# Patient Record
Sex: Female | Born: 1975 | Hispanic: No | Marital: Married | Smoking: Never smoker
Health system: Southern US, Community
[De-identification: ages and names within clinical notes are randomized; demographics above are authoritative.]

---

## 1997-11-26 ENCOUNTER — Other Ambulatory Visit: Admission: RE | Admit: 1997-11-26 | Discharge: 1997-11-26 | Payer: Self-pay | Admitting: Obstetrics and Gynecology

## 1998-11-21 ENCOUNTER — Other Ambulatory Visit: Admission: RE | Admit: 1998-11-21 | Discharge: 1998-11-21 | Payer: Self-pay | Admitting: Obstetrics and Gynecology

## 1999-12-04 ENCOUNTER — Other Ambulatory Visit: Admission: RE | Admit: 1999-12-04 | Discharge: 1999-12-04 | Payer: Self-pay | Admitting: Obstetrics and Gynecology

## 2000-05-30 ENCOUNTER — Inpatient Hospital Stay (HOSPITAL_COMMUNITY): Admission: AD | Admit: 2000-05-30 | Discharge: 2000-05-30 | Payer: Self-pay | Admitting: Obstetrics and Gynecology

## 2000-09-20 ENCOUNTER — Encounter: Payer: Self-pay | Admitting: Obstetrics and Gynecology

## 2000-09-20 ENCOUNTER — Inpatient Hospital Stay (HOSPITAL_COMMUNITY): Admission: AD | Admit: 2000-09-20 | Discharge: 2000-09-26 | Payer: Self-pay | Admitting: Obstetrics and Gynecology

## 2000-09-27 ENCOUNTER — Encounter: Admission: RE | Admit: 2000-09-27 | Discharge: 2000-10-27 | Payer: Self-pay | Admitting: Obstetrics and Gynecology

## 2000-11-27 ENCOUNTER — Encounter: Admission: RE | Admit: 2000-11-27 | Discharge: 2000-12-27 | Payer: Self-pay | Admitting: Obstetrics and Gynecology

## 2000-12-24 ENCOUNTER — Other Ambulatory Visit: Admission: RE | Admit: 2000-12-24 | Discharge: 2000-12-24 | Payer: Self-pay | Admitting: Obstetrics and Gynecology

## 2002-03-04 ENCOUNTER — Other Ambulatory Visit: Admission: RE | Admit: 2002-03-04 | Discharge: 2002-03-04 | Payer: Self-pay | Admitting: Obstetrics and Gynecology

## 2003-04-02 ENCOUNTER — Other Ambulatory Visit: Admission: RE | Admit: 2003-04-02 | Discharge: 2003-04-02 | Payer: Self-pay | Admitting: Obstetrics and Gynecology

## 2004-08-28 ENCOUNTER — Inpatient Hospital Stay (HOSPITAL_COMMUNITY): Admission: AD | Admit: 2004-08-28 | Discharge: 2004-08-28 | Payer: Self-pay | Admitting: Obstetrics and Gynecology

## 2004-09-15 ENCOUNTER — Inpatient Hospital Stay (HOSPITAL_COMMUNITY): Admission: AD | Admit: 2004-09-15 | Discharge: 2004-09-18 | Payer: Self-pay | Admitting: Obstetrics and Gynecology

## 2004-10-24 ENCOUNTER — Other Ambulatory Visit: Admission: RE | Admit: 2004-10-24 | Discharge: 2004-10-24 | Payer: Self-pay | Admitting: Obstetrics and Gynecology

## 2005-07-27 ENCOUNTER — Ambulatory Visit (HOSPITAL_BASED_OUTPATIENT_CLINIC_OR_DEPARTMENT_OTHER): Admission: RE | Admit: 2005-07-27 | Discharge: 2005-07-27 | Payer: Self-pay | Admitting: Surgery

## 2005-09-20 ENCOUNTER — Ambulatory Visit (HOSPITAL_BASED_OUTPATIENT_CLINIC_OR_DEPARTMENT_OTHER): Admission: RE | Admit: 2005-09-20 | Discharge: 2005-09-20 | Payer: Self-pay | Admitting: Orthopedic Surgery

## 2006-06-18 HISTORY — PX: REDUCTION MAMMAPLASTY: SUR839

## 2011-02-27 ENCOUNTER — Other Ambulatory Visit: Payer: Self-pay | Admitting: Obstetrics and Gynecology

## 2012-11-06 ENCOUNTER — Other Ambulatory Visit: Payer: Self-pay | Admitting: Obstetrics and Gynecology

## 2013-11-12 ENCOUNTER — Other Ambulatory Visit: Payer: Self-pay | Admitting: Obstetrics and Gynecology

## 2017-01-11 ENCOUNTER — Other Ambulatory Visit: Payer: Self-pay | Admitting: Obstetrics and Gynecology

## 2017-01-11 DIAGNOSIS — R928 Other abnormal and inconclusive findings on diagnostic imaging of breast: Secondary | ICD-10-CM

## 2017-01-16 ENCOUNTER — Ambulatory Visit
Admission: RE | Admit: 2017-01-16 | Discharge: 2017-01-16 | Disposition: A | Discharge status: Home / Self Care | Payer: 59 | Source: Ambulatory Visit | Attending: Obstetrics and Gynecology | Admitting: Obstetrics and Gynecology

## 2017-01-16 DIAGNOSIS — R928 Other abnormal and inconclusive findings on diagnostic imaging of breast: Secondary | ICD-10-CM

## 2019-03-16 IMAGING — MG 2D DIGITAL DIAGNOSTIC UNILATERAL LEFT MAMMOGRAM WITH CAD AND ADJ
6 series · 6 of 14 positions shown · non-contrast
Comparison: Previous exam(s).

CLINICAL DATA: Screening recall for a possible left breast mass.

EXAM:
2D DIGITAL DIAGNOSTIC LEFT MAMMOGRAM WITH CAD AND ADJUNCT TOMO
ULTRASOUND LEFT BREAST

[L CC synth-2D]
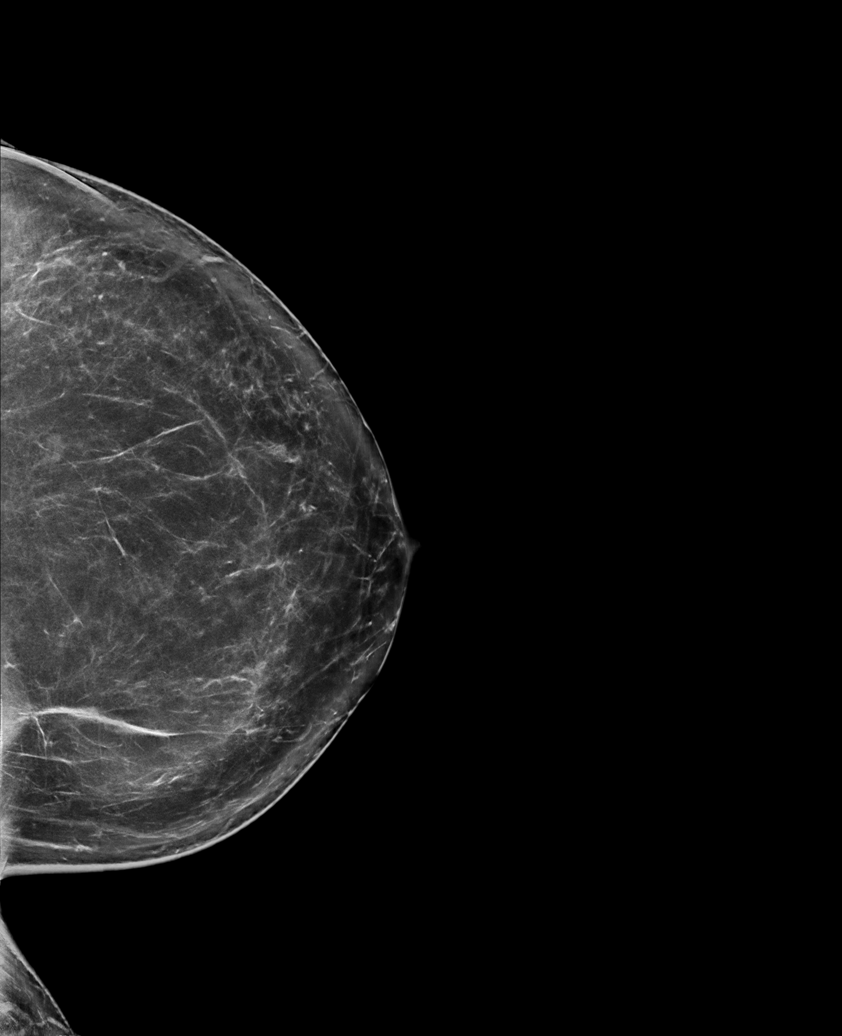

[L MLO]
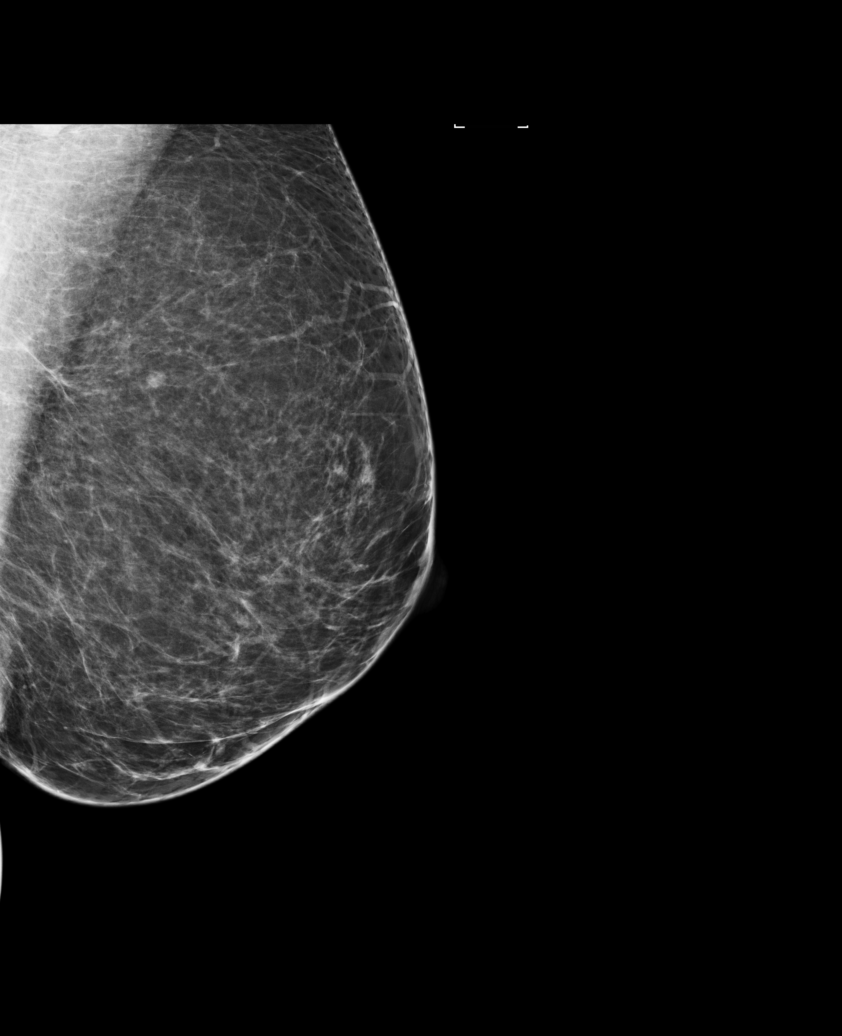

[L MLO synth-2D]
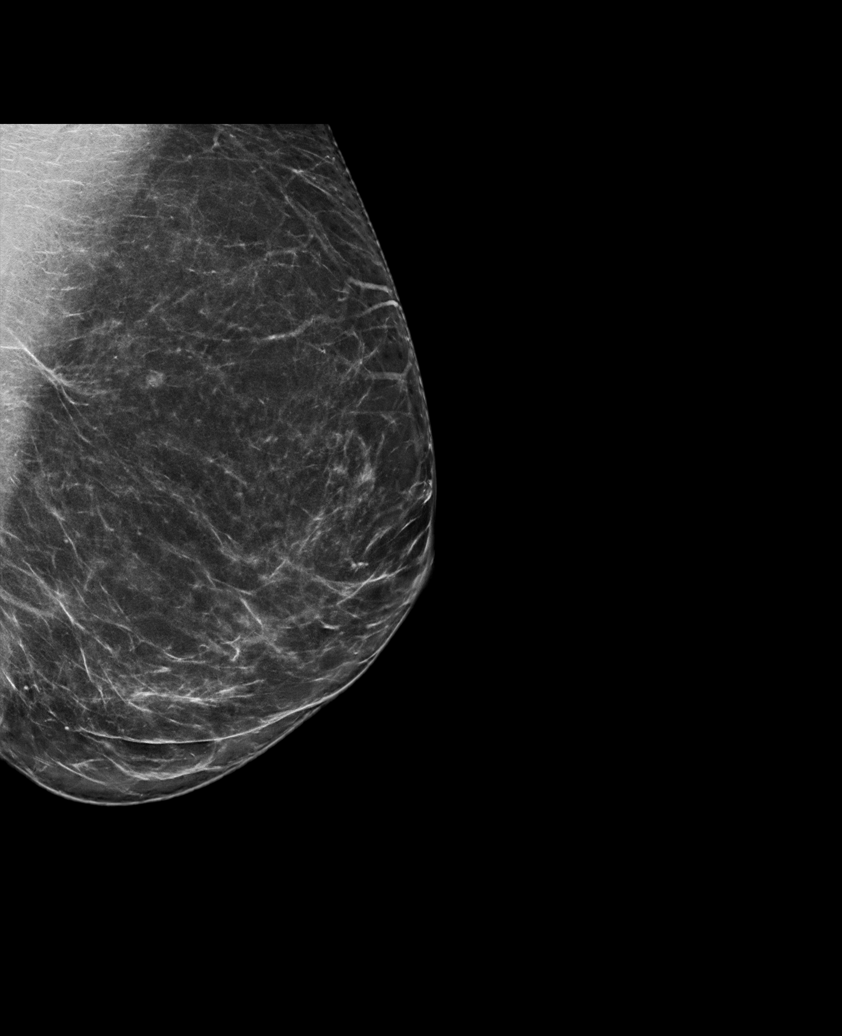

[L CC]
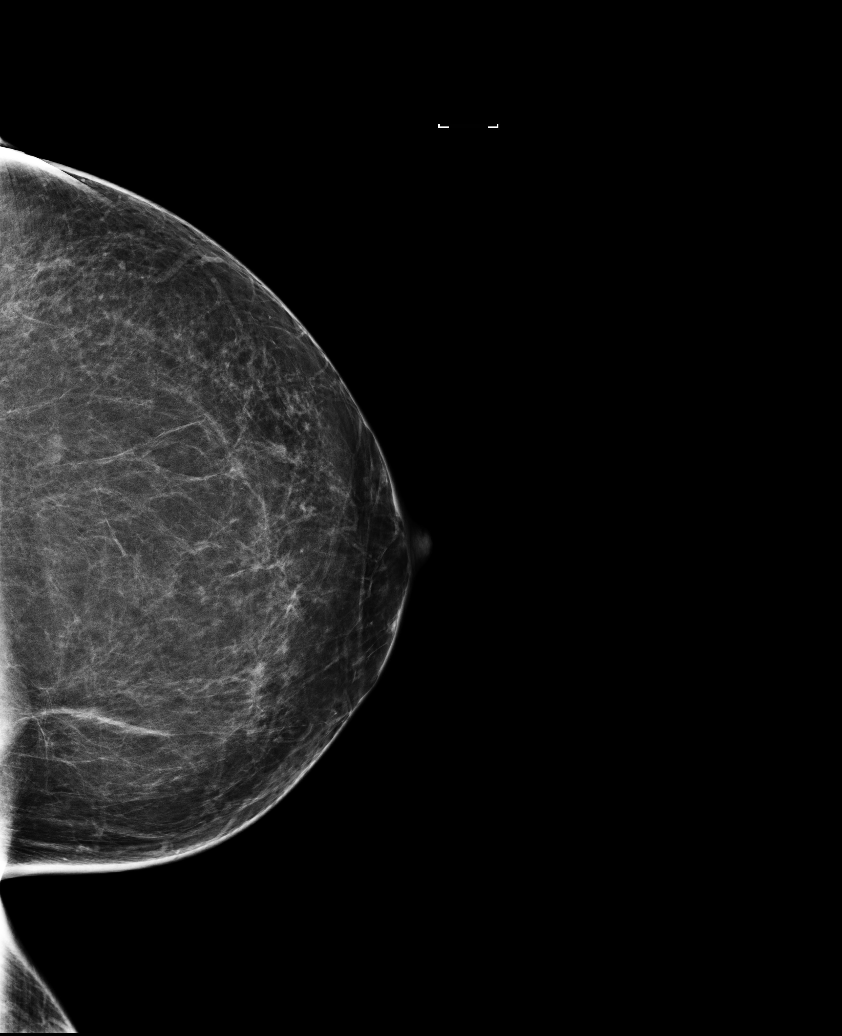

[L CC tomo · tomo slice 45/89.0]
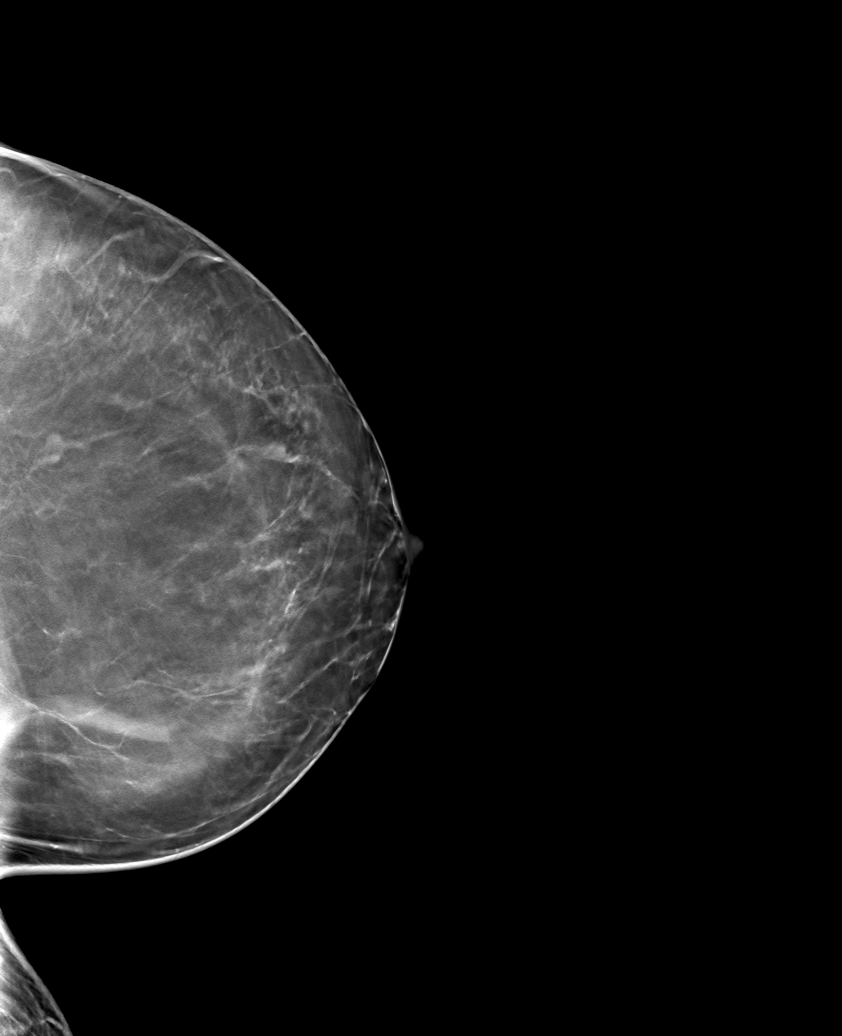

[L MLO tomo · tomo slice 43/86.0]
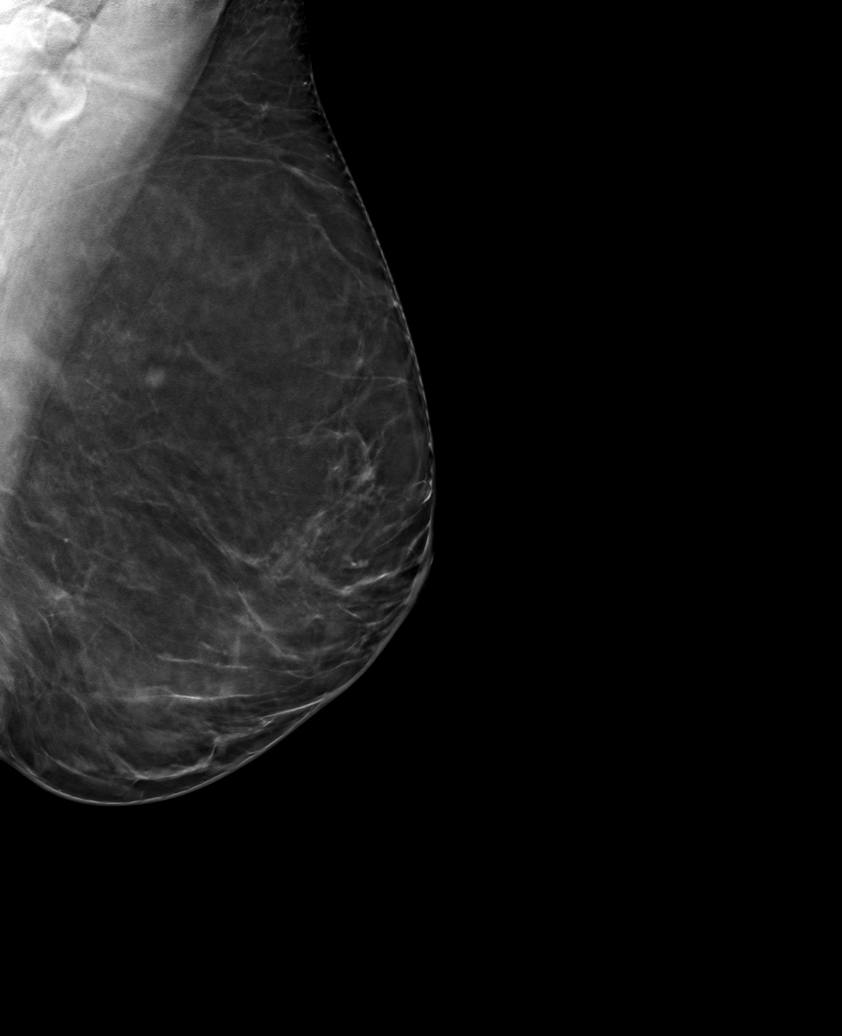

[6 of 14 positions shown; findings below may reference images not displayed]

ACR Breast Density Category b: There are scattered areas of
fibroglandular density.
FINDINGS: The possible mass persists on the diagnostic images. It is round
with circumscribed margins in the lateral left breast.

Mammographic images were processed with CAD.

Targeted ultrasound is performed, showing a small cyst in the left
breast at the 2:30 o'clock position, 4 cm the nipple, posterior
depth, measuring 4 x 3 x 4 mm, consistent in size, shape and
location to the mammographic finding. There are no solid masses or
suspicious lesions.
IMPRESSION: 1. No evidence of malignancy.
2. Small benign left breast cyst.

RECOMMENDATION:
Screening mammogram in one year.(Code:15-7-DV4)

I have discussed the findings and recommendations with the patient.
Results were also provided in writing at the conclusion of the
visit. If applicable, a reminder letter will be sent to the patient
regarding the next appointment.

BI-RADS CATEGORY  2: Benign.

## 2020-05-02 ENCOUNTER — Ambulatory Visit (INDEPENDENT_AMBULATORY_CARE_PROVIDER_SITE_OTHER): Payer: 59 | Admitting: Family Medicine

## 2020-05-02 ENCOUNTER — Other Ambulatory Visit: Payer: Self-pay

## 2020-05-02 ENCOUNTER — Encounter: Payer: Self-pay | Admitting: Family Medicine

## 2020-05-02 VITALS — BP 126/78 | HR 72 | Temp 97.9°F | Ht 65.0 in | Wt 140.8 lb

## 2020-05-02 DIAGNOSIS — R030 Elevated blood-pressure reading, without diagnosis of hypertension: Secondary | ICD-10-CM | POA: Insufficient documentation

## 2020-05-02 NOTE — Progress Notes (Signed)
New Patient Office Visit  Subjective:  Patient ID: Miranda Everett, female    DOB: [birth year]  Age: 44 y.o. MRN: [id number]  CC:  Chief Complaint  Patient presents with  . Hypertension    HPI Miranda Everett presents for elevated blood pressure-noted in [employer]. Pt with no h/o HTN. Pt states she becomes anxious in GYN office.  No headaches, palpitations, visual changes.   LMP-currently  Past Surgical History:  Procedure Laterality Date  . CESAREAN SECTION    . REDUCTION MAMMAPLASTY Bilateral 2008    Family History  Problem Relation Age of Onset  . Cancer Mother   . Alcohol abuse Father   arthritis-aunts HTN-aunt Mother-breast cancer, skin cancer  19yo-daughter and 15yo-daughter-freshman Social History   Socioeconomic History  . Marital status: Married    Spouse name: Not on file  . Number of children: Not on file  . Years of education: Not on file  . Highest education level: Not on file  Occupational History  . Not on file  Tobacco Use  . Smoking status: Never Smoker  . Smokeless tobacco: Never Used  Vaping Use  . Vaping Use: Never used  Substance and Sexual Activity  . Alcohol use: Never  . Drug use: Never  . Sexual activity: Not on file  Other Topics Concern  . Not on file  Social History Narrative  . Not on file   Social Determinants of Health   Financial Resource Strain:   . Difficulty of Paying Living Expenses: Not on file  Food Insecurity:   . Worried About Charity fundraiser in the Last Year: Not on file  . Ran Out of Food in the Last Year: Not on file  Transportation Needs:   . Lack of Transportation (Medical): Not on file  . Lack of Transportation (Non-Medical): Not on file  Physical Activity:   . Days of Exercise per Week: Not on file  . Minutes of Exercise per Session: Not on file  Stress:   . Feeling of Stress : Not on file  Social Connections:   . Frequency of Communication with Friends and Family: Not on file  .  Frequency of Social Gatherings with Friends and Family: Not on file  . Attends Religious Services: Not on file  . Active Member of Clubs or Organizations: Not on file  . Attends Archivist Meetings: Not on file  . Marital Status: Not on file  Intimate Partner Violence:   . Fear of Current or Ex-Partner: Not on file  . Emotionally Abused: Not on file  . Physically Abused: Not on file  . Sexually Abused: Not on file    ROS Review of Systems  Constitutional: Negative.   HENT:       Metallic taste  Eyes:       Glasses -readers-Lasic  Respiratory:       COVID in Sept 2021  Cardiovascular: Negative.   Gastrointestinal: Negative.   Endocrine: Negative.   Genitourinary: Negative.   Musculoskeletal: Negative.        ACL/MCL-right repair  Skin:       Derm -yearly  Allergic/Immunologic: Negative.   Neurological: Negative.   Hematological: Negative.   Psychiatric/Behavioral: Negative.     Objective:   Today's Vitals: BP 126/78 (BP Location: Left Arm, Patient Position: Sitting, Cuff Size: Normal)   Pulse 72   Temp 97.9 F (36.6 C) (Temporal)   Ht 5' 5"  (1.651 m)   Wt 140 lb 12.8 oz (  63.9 kg)   SpO2 97%   BMI 23.43 kg/m   Physical Exam Constitutional:      Appearance: Normal appearance. She is normal weight.  HENT:     Head: Normocephalic and atraumatic.     Nose: Nose normal.     Mouth/Throat:     Mouth: Mucous membranes are moist.  Eyes:     Conjunctiva/sclera: Conjunctivae normal.  Cardiovascular:     Rate and Rhythm: Normal rate and regular rhythm.     Pulses: Normal pulses.     Heart sounds: Normal heart sounds.  Pulmonary:     Effort: Pulmonary effort is normal.     Breath sounds: Normal breath sounds.  Abdominal:     General: Bowel sounds are normal.     Palpations: Abdomen is soft.  Musculoskeletal:        General: Normal range of motion.     Cervical back: Normal range of motion and neck supple.  Skin:    General: Skin is warm.    Neurological:     General: No focal deficit present.     Mental Status: She is alert.  Psychiatric:        Mood and Affect: Mood normal.     Assessment & Plan:  1. Elevated blood pressure reading - EKG 12-Lead-reviewed-sr - CMP14+EGFR - T4 AND TSH - Lipid Profile - CBC with Differential/Platelet Follow-up:  Continue to check blood pressure at home-if elevated over 130/80 consistently-schedule follow up to discuss medication  Mally Gavina Hannah Beat, MD

## 2020-05-02 NOTE — Patient Instructions (Signed)
Managing Your Hypertension Hypertension is commonly called high blood pressure. This is when the force of your blood pressing against the walls of your arteries is too strong. Arteries are blood vessels that carry blood from your heart throughout your body. Hypertension forces the heart to work harder to pump blood, and may cause the arteries to become narrow or stiff. Having untreated or uncontrolled hypertension can cause heart attack, stroke, kidney disease, and other problems. What are blood pressure readings? A blood pressure reading consists of a higher number over a lower number. Ideally, your blood pressure should be below 120/80. The first ("top") number is called the systolic pressure. It is a measure of the pressure in your arteries as your heart beats. The second ("bottom") number is called the diastolic pressure. It is a measure of the pressure in your arteries as the heart relaxes. What does my blood pressure reading mean? Blood pressure is classified into four stages. Based on your blood pressure reading, your health care provider may use the following stages to determine what type of treatment you need, if any. Systolic pressure and diastolic pressure are measured in a unit called mm Hg. Normal  Systolic pressure: below 120.  Diastolic pressure: below 80. Elevated  Systolic pressure: 120-129.  Diastolic pressure: below 80. Hypertension stage 1  Systolic pressure: 130-139.  Diastolic pressure: 80-89. Hypertension stage 2  Systolic pressure: 140 or above.  Diastolic pressure: 90 or above. What health risks are associated with hypertension? Managing your hypertension is an important responsibility. Uncontrolled hypertension can lead to:  A heart attack.  A stroke.  A weakened blood vessel (aneurysm).  Heart failure.  Kidney damage.  Eye damage.  Metabolic syndrome.  Memory and concentration problems. What changes can I make to manage my  hypertension? Hypertension can be managed by making lifestyle changes and possibly by taking medicines. Your health care provider will help you make a plan to bring your blood pressure within a normal range. Eating and drinking   Eat a diet that is high in fiber and potassium, and low in salt (sodium), added sugar, and fat. An example eating plan is called the DASH (Dietary Approaches to Stop Hypertension) diet. To eat this way: ? Eat plenty of fresh fruits and vegetables. Try to fill half of your plate at each meal with fruits and vegetables. ? Eat whole grains, such as whole wheat pasta, brown rice, or whole grain bread. Fill about one quarter of your plate with whole grains. ? Eat low-fat diary products. ? Avoid fatty cuts of meat, processed or cured meats, and poultry with skin. Fill about one quarter of your plate with lean proteins such as fish, chicken without skin, beans, eggs, and tofu. ? Avoid premade and processed foods. These tend to be higher in sodium, added sugar, and fat.  Reduce your daily sodium intake. Most people with hypertension should eat less than 1,500 mg of sodium a day.  Limit alcohol intake to no more than 1 drink a day for nonpregnant women and 2 drinks a day for men. One drink equals 12 oz of beer, 5 oz of wine, or 1 oz of hard liquor. Lifestyle  Work with your health care provider to maintain a healthy body weight, or to lose weight. Ask what an ideal weight is for you.  Get at least 30 minutes of exercise that causes your heart to beat faster (aerobic exercise) most days of the week. Activities may include walking, swimming, or biking.  Include exercise   to strengthen your muscles (resistance exercise), such as weight lifting, as part of your weekly exercise routine. Try to do these types of exercises for 30 minutes at least 3 days a week.  Do not use any products that contain nicotine or tobacco, such as cigarettes and e-cigarettes. If you need help quitting,  ask your health care provider.  Control any long-term (chronic) conditions you have, such as high cholesterol or diabetes. Monitoring  Monitor your blood pressure at home as told by your health care provider. Your personal target blood pressure may vary depending on your medical conditions, your age, and other factors.  Have your blood pressure checked regularly, as often as told by your health care provider. Working with your health care provider  Review all the medicines you take with your health care provider because there may be side effects or interactions.  Talk with your health care provider about your diet, exercise habits, and other lifestyle factors that may be contributing to hypertension.  Visit your health care provider regularly. Your health care provider can help you create and adjust your plan for managing hypertension. Will I need medicine to control my blood pressure? Your health care provider may prescribe medicine if lifestyle changes are not enough to get your blood pressure under control, and if:  Your systolic blood pressure is 130 or higher.  Your diastolic blood pressure is 80 or higher. Take medicines only as told by your health care provider. Follow the directions carefully. Blood pressure medicines must be taken as prescribed. The medicine does not work as well when you skip doses. Skipping doses also puts you at risk for problems. Contact a health care provider if:  You think you are having a reaction to medicines you have taken.  You have repeated (recurrent) headaches.  You feel dizzy.  You have swelling in your ankles.  You have trouble with your vision. Get help right away if:  You develop a severe headache or confusion.  You have unusual weakness or numbness, or you feel faint.  You have severe pain in your chest or abdomen.  You vomit repeatedly.  You have trouble breathing. Summary  Hypertension is when the force of blood pumping  through your arteries is too strong. If this condition is not controlled, it may put you at risk for serious complications.  Your personal target blood pressure may vary depending on your medical conditions, your age, and other factors. For most people, a normal blood pressure is less than 120/80.  Hypertension is managed by lifestyle changes, medicines, or both. Lifestyle changes include weight loss, eating a healthy, low-sodium diet, exercising more, and limiting alcohol. This information is not intended to replace advice given to you by your health care provider. Make sure you discuss any questions you have with your health care provider. Document Revised: 09/26/2018 Document Reviewed: 05/02/2016 Elsevier Patient Education  [place].

## 2020-05-04 LAB — LIPID PANEL
Chol/HDL Ratio: 4.4 ratio (ref 0.0–4.4)
Cholesterol, Total: 182 mg/dL (ref 100–199)
HDL: 41 mg/dL (ref >39)
LDL Chol Calc (NIH): 126 mg/dL — ABNORMAL HIGH (ref 0–99)
Triglycerides: 80 mg/dL (ref 0–149)
VLDL Cholesterol Cal: 15 mg/dL (ref 5–40)

## 2020-05-04 LAB — CMP14+EGFR
ALT: 10 IU/L (ref 0–32)
AST: 11 IU/L (ref 0–40)
Albumin/Globulin Ratio: 1.7 (ref 1.2–2.2)
Albumin: 4.4 g/dL (ref 3.8–4.8)
Alkaline Phosphatase: 94 IU/L (ref 44–121)
BUN/Creatinine Ratio: 11 (ref 9–23)
BUN: 10 mg/dL (ref 6–24)
Bilirubin Total: 0.5 mg/dL (ref 0.0–1.2)
CO2: 20 mmol/L (ref 20–29)
Calcium: 9.1 mg/dL (ref 8.7–10.2)
Chloride: 101 mmol/L (ref 96–106)
Creatinine, Ser: 0.87 mg/dL (ref 0.57–1.00)
GFR calc Af Amer: 93 mL/min/{1.73_m2} (ref >59)
GFR calc non Af Amer: 81 mL/min/{1.73_m2} (ref >59)
Globulin, Total: 2.6 g/dL (ref 1.5–4.5)
Glucose: 92 mg/dL (ref 65–99)
Potassium: 4.4 mmol/L (ref 3.5–5.2)
Sodium: 137 mmol/L (ref 134–144)
Total Protein: 7 g/dL (ref 6.0–8.5)

## 2020-05-04 LAB — CBC WITH DIFFERENTIAL/PLATELET
Basophils Absolute: 0 10*3/uL (ref 0.0–0.2)
Basos: 0 %
EOS (ABSOLUTE): 0.1 10*3/uL (ref 0.0–0.4)
Eos: 2 %
Hematocrit: 41.7 % (ref 34.0–46.6)
Hemoglobin: 14.3 g/dL (ref 11.1–15.9)
Immature Grans (Abs): 0 10*3/uL (ref 0.0–0.1)
Immature Granulocytes: 0 %
Lymphocytes Absolute: 1.1 10*3/uL (ref 0.7–3.1)
Lymphs: 19 %
MCH: 31.2 pg (ref 26.6–33.0)
MCHC: 34.3 g/dL (ref 31.5–35.7)
MCV: 91 fL (ref 79–97)
Monocytes Absolute: 0.4 10*3/uL (ref 0.1–0.9)
Monocytes: 6 %
Neutrophils Absolute: 4.3 10*3/uL (ref 1.4–7.0)
Neutrophils: 73 %
Platelets: 300 10*3/uL (ref 150–450)
RBC: 4.58 x10E6/uL (ref 3.77–5.28)
RDW: 12.4 % (ref 11.7–15.4)
WBC: 5.9 10*3/uL (ref 3.4–10.8)

## 2020-05-04 LAB — CARDIOVASCULAR RISK ASSESSMENT

## 2020-05-04 LAB — T4 AND TSH
T4, Total: 7.8 ug/dL (ref 4.5–12.0)
TSH: 0.792 u[IU]/mL (ref 0.450–4.500)

## 2023-02-08 ENCOUNTER — Other Ambulatory Visit: Payer: Self-pay | Admitting: Oncology

## 2023-02-08 DIAGNOSIS — Z006 Encounter for examination for normal comparison and control in clinical research program: Secondary | ICD-10-CM

## 2023-05-24 ENCOUNTER — Other Ambulatory Visit
Admission: RE | Admit: 2023-05-24 | Discharge: 2023-05-24 | Disposition: A | Discharge status: Home / Self Care | Payer: Self-pay | Source: Ambulatory Visit | Attending: Oncology | Admitting: Oncology

## 2023-05-24 DIAGNOSIS — Z006 Encounter for examination for normal comparison and control in clinical research program: Secondary | ICD-10-CM | POA: Insufficient documentation

## 2023-06-04 LAB — GENECONNECT MOLECULAR SCREEN: Genetic Analysis Overall Interpretation: NEGATIVE

## 2023-06-27 DIAGNOSIS — Z01419 Encounter for gynecological examination (general) (routine) without abnormal findings: Secondary | ICD-10-CM | POA: Diagnosis not present

## 2023-06-27 DIAGNOSIS — Z1231 Encounter for screening mammogram for malignant neoplasm of breast: Secondary | ICD-10-CM | POA: Diagnosis not present

## 2023-06-27 LAB — HM MAMMOGRAPHY

## 2023-08-21 NOTE — Progress Notes (Unsigned)
 Subjective:  Patient ID: Miranda Everett, female    DOB: 01/13/1976  Age: 48 y.o. MRN: [id number]  No chief complaint on file.   Patient is establishing as a new patient.  HPI       No data to display                No data to display           No current outpatient medications on file prior to visit.   No current facility-administered medications on file prior to visit.  . Social History   Socioeconomic History   Marital status: Married    Spouse name: Not on file   Number of children: Not on file   Years of education: Not on file   Highest education level: Bachelor's degree (e.g., BA, AB, BS)  Occupational History   Not on file  Tobacco Use   Smoking status: Never   Smokeless tobacco: Never  Vaping Use   Vaping status: Never Used  Substance and Sexual Activity   Alcohol use: Never   Drug use: Never   Sexual activity: Not on file  Other Topics Concern   Not on file  Social History Narrative   Not on file   Social Drivers of Health   Financial Resource Strain: Low Risk  (08/15/2023)   Overall Financial Resource Strain (CARDIA)    Difficulty of Paying Living Expenses: Not hard at all  Food Insecurity: No Food Insecurity (08/15/2023)   Hunger Vital Sign    Worried About Running Out of Food in the Last Year: Never true    Ran Out of Food in the Last Year: Never true  Transportation Needs: No Transportation Needs (08/15/2023)   PRAPARE - Administrator, Civil Service (Medical): No    Lack of Transportation (Non-Medical): No  Physical Activity: Insufficiently Active (08/15/2023)   Exercise Vital Sign    Days of Exercise per Week: 2 days    Minutes of Exercise per Session: 20 min  Stress: No Stress Concern Present (08/15/2023)   [employer] of Occupational Health - Occupational Stress Questionnaire    Feeling of Stress : Only a little  Social Connections: Socially Integrated (08/15/2023)   Social Connection and Isolation Panel  [NHANES]    Frequency of Communication with Friends and Family: More than three times a week    Frequency of Social Gatherings with Friends and Family: Once a week    Attends Religious Services: More than 4 times per year    Active Member of [employer] or Organizations: Yes    Attends Engineer, structural: More than 4 times per year    Marital Status: Married   No past medical history on file. Family History  Problem Relation Age of Onset   Cancer Mother    Alcohol abuse Father     Review of Systems  Constitutional:  Negative for appetite change, fatigue and fever.  HENT:  Negative for congestion, ear pain, sinus pressure and sore throat.   Respiratory:  Negative for cough, chest tightness, shortness of breath and wheezing.   Cardiovascular:  Negative for chest pain and palpitations.  Gastrointestinal:  Negative for abdominal pain, constipation, diarrhea, nausea and vomiting.  Genitourinary:  Negative for dysuria and hematuria.  Musculoskeletal:  Negative for arthralgias, back pain, joint swelling and myalgias.  Skin:  Negative for rash.  Neurological:  Negative for dizziness, weakness and headaches.  Psychiatric/Behavioral:  Negative for dysphoric mood. The patient is not  nervous/anxious.      Objective:  There were no vitals taken for this visit.     05/02/2020    8:30 AM  BP/Weight  Systolic BP 126  Diastolic BP 78  Wt. (Lbs) 140.8  BMI 23.43 kg/m2    Physical Exam Diabetic Foot Exam - Simple   No data filed      Lab Results  Component Value Date   WBC 5.9 05/02/2020   HGB 14.3 05/02/2020   HCT 41.7 05/02/2020   PLT 300 05/02/2020   GLUCOSE 92 05/02/2020   CHOL 182 05/02/2020   TRIG 80 05/02/2020   HDL 41 05/02/2020   LDLCALC 126 (H) 05/02/2020   ALT 10 05/02/2020   AST 11 05/02/2020   NA 137 05/02/2020   K 4.4 05/02/2020   CL 101 05/02/2020   CREATININE 0.87 05/02/2020   BUN 10 05/02/2020   CO2 20 05/02/2020   TSH 0.792 05/02/2020       Assessment & Plan:  No problem-specific Assessment & Plan notes found for this encounter.    No orders of the defined types were placed in this encounter.  No orders of the defined types were placed in this encounter.     Follow-up: No follow-ups on file.  AVS was given to patient prior to departure.   [facility] [phone]

## 2023-08-22 ENCOUNTER — Ambulatory Visit (INDEPENDENT_AMBULATORY_CARE_PROVIDER_SITE_OTHER): Payer: Self-pay | Admitting: Physician Assistant

## 2023-08-22 ENCOUNTER — Encounter: Payer: Self-pay | Admitting: Physician Assistant

## 2023-08-22 VITALS — BP 130/80 | HR 82 | Temp 98.3°F | Resp 16 | Ht 65.0 in | Wt 140.9 lb

## 2023-08-22 DIAGNOSIS — J302 Other seasonal allergic rhinitis: Secondary | ICD-10-CM | POA: Diagnosis not present

## 2023-08-22 DIAGNOSIS — Z1212 Encounter for screening for malignant neoplasm of rectum: Secondary | ICD-10-CM | POA: Insufficient documentation

## 2023-08-22 DIAGNOSIS — Z1211 Encounter for screening for malignant neoplasm of colon: Secondary | ICD-10-CM | POA: Diagnosis not present

## 2023-08-22 DIAGNOSIS — Z131 Encounter for screening for diabetes mellitus: Secondary | ICD-10-CM | POA: Diagnosis not present

## 2023-08-22 DIAGNOSIS — R030 Elevated blood-pressure reading, without diagnosis of hypertension: Secondary | ICD-10-CM | POA: Diagnosis not present

## 2023-08-22 DIAGNOSIS — Z1159 Encounter for screening for other viral diseases: Secondary | ICD-10-CM | POA: Diagnosis not present

## 2023-08-22 DIAGNOSIS — Z91038 Other insect allergy status: Secondary | ICD-10-CM | POA: Insufficient documentation

## 2023-08-22 DIAGNOSIS — Z23 Encounter for immunization: Secondary | ICD-10-CM | POA: Insufficient documentation

## 2023-08-22 MED ORDER — EPINEPHRINE 0.3 MG/0.3ML IJ SOAJ: 0.3000 mg | INTRAMUSCULAR | 1 refills | Status: AC | PRN

## 2023-08-22 NOTE — Progress Notes (Signed)
 Complete physical exam  Patient: Miranda Everett   DOB: 1975/12/28   48 y.o. Female  MRN: [id number]  Subjective:    Chief Complaint  Patient presents with   Annual Exam    Miranda Everett is a 48 y.o. female who presents today for a complete physical exam. She reports consuming a general diet. The patient has a physically strenuous job, but has no regular exercise apart from work.  She generally feels well. She reports sleeping well. She does not have additional problems to discuss today.    Most recent fall risk assessment:    08/22/2023    8:14 AM  Fall Risk   Falls in the past year? 0  Number falls in past yr: 0  Injury with Fall? 0  Risk for fall due to : No Fall Risks  Follow up Falls evaluation completed     Most recent depression screenings:    08/22/2023    8:14 AM  PHQ 2/9 Scores  PHQ - 2 Score 0  PHQ- 9 Score 0    Vision:Not within last year   History reviewed. No pertinent past medical history. Past Surgical History:  Procedure Laterality Date   CESAREAN SECTION     REDUCTION MAMMAPLASTY Bilateral 2008   Social History   Tobacco Use   Smoking status: Never   Smokeless tobacco: Never  Vaping Use   Vaping status: Never Used  Substance Use Topics   Alcohol use: Never   Drug use: Never   No Known Allergies    Patient Care Team: Practice, [facility] as PCP - General   Outpatient Medications Prior to Visit  Medication Sig Note   [DISCONTINUED] EPINEPHrine (EPIPEN 2-PAK) 0.3 mg/0.3 mL IJ SOAJ injection Inject 0.3 mg into the muscle as needed for anaphylaxis. 08/22/2023: As needed   No facility-administered medications prior to visit.    Review of Systems  All other systems reviewed and are negative.         Objective:     BP 130/80   Pulse 82   Temp 98.3 F (36.8 C) (Oral)   Resp 16   Ht 5\' 5"  (1.651 m)   Wt 140 lb 14.1 oz (63.9 kg)   LMP 08/18/2023 (Exact Date)   SpO2 97%   BMI 23.44 kg/m  BP Readings from Last 3  Encounters:  08/22/23 130/80  05/02/20 126/78   Wt Readings from Last 3 Encounters:  08/22/23 140 lb 14.1 oz (63.9 kg)  05/02/20 140 lb 12.8 oz (63.9 kg)      Physical Exam Constitutional:      Appearance: Normal appearance.  HENT:     Right Ear: Tympanic membrane normal.     Left Ear: Tympanic membrane normal.     Nose: Nose normal.     Mouth/Throat:     Pharynx: No oropharyngeal exudate or posterior oropharyngeal erythema.  Eyes:     Conjunctiva/sclera: Conjunctivae normal.  Neck:     Vascular: No carotid bruit.  Cardiovascular:     Rate and Rhythm: Normal rate and regular rhythm.     Heart sounds: Normal heart sounds.  Pulmonary:     Effort: Pulmonary effort is normal.     Breath sounds: Normal breath sounds.  Abdominal:     General: Bowel sounds are normal.     Palpations: Abdomen is soft.     Tenderness: There is no abdominal tenderness.  Skin:    Findings: No lesion or rash.  Neurological:  Mental Status: She is alert and oriented to person, place, and time.  Psychiatric:        Behavior: Behavior normal.      No results found for any visits on 08/22/23. Last CBC Lab Results  Component Value Date   WBC 5.9 05/02/2020   HGB 14.3 05/02/2020   HCT 41.7 05/02/2020   MCV 91 05/02/2020   MCH 31.2 05/02/2020   RDW 12.4 05/02/2020   PLT 300 05/02/2020   Last metabolic panel Lab Results  Component Value Date   GLUCOSE 92 05/02/2020   NA 137 05/02/2020   K 4.4 05/02/2020   CL 101 05/02/2020   CO2 20 05/02/2020   BUN 10 05/02/2020   CREATININE 0.87 05/02/2020   GFRNONAA 81 05/02/2020   CALCIUM 9.1 05/02/2020   PROT 7.0 05/02/2020   ALBUMIN 4.4 05/02/2020   LABGLOB 2.6 05/02/2020   AGRATIO 1.7 05/02/2020   BILITOT 0.5 05/02/2020   ALKPHOS 94 05/02/2020   AST 11 05/02/2020   ALT 10 05/02/2020   Last lipids Lab Results  Component Value Date   CHOL 182 05/02/2020   HDL 41 05/02/2020   LDLCALC 126 (H) 05/02/2020   TRIG 80 05/02/2020   CHOLHDL  4.4 05/02/2020   Last hemoglobin A1c No results found for: "HGBA1C" Last thyroid functions Lab Results  Component Value Date   TSH 0.792 05/02/2020   T4TOTAL 7.8 05/02/2020        Assessment & Plan:    Routine Health Maintenance and Physical Exam  Immunization History  Administered Date(s) Administered   Influenza-Unspecified 02/16/2013    Health Maintenance  Topic Date Due   HIV Screening  Never done   Hepatitis C Screening  Never done   DTaP/Tdap/Td (1 - Tdap) Never done   Cervical Cancer Screening (HPV/Pap Cotest)  11/13/2018   Colonoscopy  Never done   INFLUENZA VACCINE  09/16/2023 (Originally 01/17/2023)   COVID-19 Vaccine (1 - 2024-25 season) 04/08/2024 (Originally 02/17/2023)   HPV VACCINES  Aged Out    Discussed health benefits of physical activity, and encouraged her to engage in regular exercise appropriate for her age and condition.  Problem List Items Addressed This Visit     Elevated blood pressure reading   Relevant Orders   CBC with Differential/Platelet   CMP14+EGFR   Lipid panel   TSH   T4, free   Screening for colorectal cancer - Primary   Relevant Orders   Amb Referral to Colonoscopy   Seasonal allergies   Other Visit Diagnoses       Immunization due       Relevant Orders   Tdap vaccine greater than or equal to 7yo IM     Screening for viral disease       Relevant Orders   Hepatitis C antibody   HIV Antibody (routine testing w rflx)     Screening for diabetes mellitus       Relevant Orders   Hemoglobin A1c     History of insect sting allergy       Relevant Medications   EPINEPHrine (EPIPEN 2-PAK) 0.3 mg/0.3 mL IJ SOAJ injection      Return in about 3 months (around 11/22/2023) for Chronic, [place] Foley.     Langley Gauss, [place]

## 2023-08-22 NOTE — Patient Instructions (Signed)
 VISIT SUMMARY:  Today, you came in to establish care and for a physical examination. You reported feeling generally well with no significant complaints. 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 You also mentioned that you are due for a colonoscopy. We reviewed your health maintenance needs and planned for routine follow-up.  YOUR PLAN:  -ESTABLISHING CARE: You are a new patient establishing care with no significant complaints or concerns. We will continue with routine health maintenance and follow-up in 3 months.  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 We renewed your EpiPen prescription to ensure you are prepared in case of another reaction.  -COLON CANCER SCREENING: At 48 years old, you are due for your initial colon cancer screening. We have referred you for a colonoscopy, which is a procedure to check for abnormalities in your colon.  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  -BLOOD PRESSURE: You had elevated blood pressure readings after your COVID-19 infection, but it is currently normal. We will continue to monitor your blood pressure at follow-up visits to ensure it remains stable.  -LABORATORY WORK: As part of routine care for a new patient, we ordered several laboratory tests including a complete blood count, comprehensive metabolic panel, lipid panel, thyroid stimulating hormone, and hemoglobin A1c to check your overall health.  INSTRUCTIONS:  Please schedule a follow-up appointment in 3 months to review your laboratory results and discuss any necessary interventions. Additionally, make sure to schedule your colonoscopy as referred.  For more information, you can read  your full clinical note, available in your patient portal.

## 2023-08-23 ENCOUNTER — Encounter: Payer: Self-pay | Admitting: Physician Assistant

## 2023-08-23 LAB — CMP14+EGFR
ALT: 13 IU/L (ref 0–32)
AST: 14 IU/L (ref 0–40)
Albumin: 4.5 g/dL (ref 3.9–4.9)
Alkaline Phosphatase: 101 IU/L (ref 44–121)
BUN/Creatinine Ratio: 14 (ref 9–23)
BUN: 13 mg/dL (ref 6–24)
Bilirubin Total: 0.6 mg/dL (ref 0.0–1.2)
CO2: 23 mmol/L (ref 20–29)
Calcium: 9.6 mg/dL (ref 8.7–10.2)
Chloride: 102 mmol/L (ref 96–106)
Creatinine, Ser: 0.92 mg/dL (ref 0.57–1.00)
Globulin, Total: 2.8 g/dL (ref 1.5–4.5)
Glucose: 91 mg/dL (ref 70–99)
Potassium: 4.4 mmol/L (ref 3.5–5.2)
Sodium: 137 mmol/L (ref 134–144)
Total Protein: 7.3 g/dL (ref 6.0–8.5)
eGFR: 77 mL/min/{1.73_m2} (ref >59)

## 2023-08-23 LAB — HEMOGLOBIN A1C
Est. average glucose Bld gHb Est-mCnc: 117 mg/dL
Hgb A1c MFr Bld: 5.7 % — ABNORMAL HIGH (ref 4.8–5.6)

## 2023-08-23 LAB — HIV ANTIBODY (ROUTINE TESTING W REFLEX): HIV Screen 4th Generation wRfx: NONREACTIVE

## 2023-08-23 LAB — T4, FREE: Free T4: 1.48 ng/dL (ref 0.82–1.77)

## 2023-08-23 LAB — CBC WITH DIFFERENTIAL/PLATELET
Basophils Absolute: 0 10*3/uL (ref 0.0–0.2)
Basos: 0 %
EOS (ABSOLUTE): 0.1 10*3/uL (ref 0.0–0.4)
Eos: 2 %
Hematocrit: 43.1 % (ref 34.0–46.6)
Hemoglobin: 14.7 g/dL (ref 11.1–15.9)
Immature Grans (Abs): 0 10*3/uL (ref 0.0–0.1)
Immature Granulocytes: 0 %
Lymphocytes Absolute: 1.4 10*3/uL (ref 0.7–3.1)
Lymphs: 29 %
MCH: 32.1 pg (ref 26.6–33.0)
MCHC: 34.1 g/dL (ref 31.5–35.7)
MCV: 94 fL (ref 79–97)
Monocytes Absolute: 0.3 10*3/uL (ref 0.1–0.9)
Monocytes: 7 %
Neutrophils Absolute: 3 10*3/uL (ref 1.4–7.0)
Neutrophils: 62 %
Platelets: 317 10*3/uL (ref 150–450)
RBC: 4.58 x10E6/uL (ref 3.77–5.28)
RDW: 12.8 % (ref 11.7–15.4)
WBC: 4.8 10*3/uL (ref 3.4–10.8)

## 2023-08-23 LAB — LIPID PANEL
Chol/HDL Ratio: 4.1 ratio (ref 0.0–4.4)
Cholesterol, Total: 173 mg/dL (ref 100–199)
HDL: 42 mg/dL (ref >39)
LDL Chol Calc (NIH): 115 mg/dL — ABNORMAL HIGH (ref 0–99)
Triglycerides: 85 mg/dL (ref 0–149)
VLDL Cholesterol Cal: 16 mg/dL (ref 5–40)

## 2023-08-23 LAB — TSH: TSH: 0.752 u[IU]/mL (ref 0.450–4.500)

## 2023-08-23 LAB — HEPATITIS C ANTIBODY: Hep C Virus Ab: NONREACTIVE

## 2023-08-27 ENCOUNTER — Telehealth: Payer: Self-pay

## 2023-08-27 ENCOUNTER — Other Ambulatory Visit: Payer: Self-pay

## 2023-08-27 DIAGNOSIS — Z1211 Encounter for screening for malignant neoplasm of colon: Secondary | ICD-10-CM

## 2023-08-27 MED ORDER — NA SULFATE-K SULFATE-MG SULF 17.5-3.13-1.6 GM/177ML PO SOLN: 1.0000 | Freq: Once | ORAL | 0 refills | Status: AC

## 2023-08-27 NOTE — Telephone Encounter (Signed)
 Pt requesting call back to schedule colonoscopy.

## 2023-08-27 NOTE — Telephone Encounter (Signed)
 Gastroenterology Pre-Procedure Review  Request Date: 09/25/23 Requesting Physician: Dr. Tobi Bastos  PATIENT REVIEW QUESTIONS: The patient responded to the following health history questions as indicated:    1. Are you having any GI issues? no 2. Do you have a personal history of Polyps? no 3. Do you have a family history of Colon Cancer or Polyps? no 4. Diabetes Mellitus? no 5. Joint replacements in the past 12 months?no 6. Major health problems in the past 3 months?no 7. Any artificial heart valves, MVP, or defibrillator?no    MEDICATIONS & ALLERGIES:    Patient reports the following regarding taking any anticoagulation/antiplatelet therapy:   Plavix, Coumadin, Eliquis, Xarelto, Lovenox, Pradaxa, Brilinta, or Effient? no Aspirin? no  Patient confirms/reports the following medications:  Current Outpatient Medications  Medication Sig Dispense Refill   EPINEPHrine (EPIPEN 2-PAK) 0.3 mg/0.3 mL IJ SOAJ injection Inject 0.3 mg into the muscle as needed for anaphylaxis. 1 each 1   No current facility-administered medications for this visit.    Patient confirms/reports the following allergies:  Allergies  Allergen Reactions   Fire Ant Anaphylaxis    No orders of the defined types were placed in this encounter.   AUTHORIZATION INFORMATION Primary Insurance: 1D#: Group #:  Secondary Insurance: 1D#: Group #:  SCHEDULE INFORMATION: Date: 09/25/23 Time: Location: [facility]

## 2023-09-18 ENCOUNTER — Encounter: Payer: Self-pay | Admitting: Gastroenterology

## 2023-09-25 ENCOUNTER — Ambulatory Visit
Admission: RE | Admit: 2023-09-25 | Discharge: 2023-09-25 | Disposition: A | Discharge status: Home / Self Care | Attending: Gastroenterology | Admitting: Gastroenterology

## 2023-09-25 ENCOUNTER — Encounter
Admission: RE | Disposition: A | Discharge status: Home / Self Care | Payer: Self-pay | Source: Home / Self Care | Attending: Gastroenterology

## 2023-09-25 ENCOUNTER — Encounter: Payer: Self-pay | Admitting: Gastroenterology

## 2023-09-25 ENCOUNTER — Ambulatory Visit: Admitting: Anesthesiology

## 2023-09-25 DIAGNOSIS — Z1211 Encounter for screening for malignant neoplasm of colon: Secondary | ICD-10-CM | POA: Diagnosis not present

## 2023-09-25 DIAGNOSIS — K5730 Diverticulosis of large intestine without perforation or abscess without bleeding: Secondary | ICD-10-CM

## 2023-09-25 HISTORY — PX: COLONOSCOPY: SHX5424

## 2023-09-25 LAB — POCT PREGNANCY, URINE: Preg Test, Ur: NEGATIVE

## 2023-09-25 SURGERY — COLONOSCOPY
Anesthesia: General

## 2023-09-25 MED ORDER — GLYCOPYRROLATE 0.2 MG/ML IJ SOLN
INTRAMUSCULAR | Status: AC
  Filled 2023-09-25: qty 1

## 2023-09-25 MED ORDER — SODIUM CHLORIDE 0.9 % IV SOLN
INTRAVENOUS | Status: DC
Start: 2023-09-25 — End: 2023-09-25

## 2023-09-25 MED ORDER — LIDOCAINE HCL (CARDIAC) PF 100 MG/5ML IV SOSY
PREFILLED_SYRINGE | INTRAVENOUS | Status: DC | PRN
  Administered 2023-09-25: 60 mg via INTRAVENOUS

## 2023-09-25 MED ORDER — DEXMEDETOMIDINE HCL IN NACL 80 MCG/20ML IV SOLN
INTRAVENOUS | Status: DC | PRN
  Administered 2023-09-25: 20 ug via INTRAVENOUS

## 2023-09-25 MED ORDER — LIDOCAINE HCL (PF) 2 % IJ SOLN
INTRAMUSCULAR | Status: AC
  Filled 2023-09-25: qty 5

## 2023-09-25 MED ORDER — PROPOFOL 1000 MG/100ML IV EMUL
INTRAVENOUS | Status: AC
  Filled 2023-09-25: qty 100

## 2023-09-25 MED ORDER — PHENYLEPHRINE 80 MCG/ML (10ML) SYRINGE FOR IV PUSH (FOR BLOOD PRESSURE SUPPORT)
PREFILLED_SYRINGE | INTRAVENOUS | Status: AC
  Filled 2023-09-25: qty 10

## 2023-09-25 MED ORDER — PROPOFOL 500 MG/50ML IV EMUL
INTRAVENOUS | Status: DC | PRN
  Administered 2023-09-25: 100 ug/kg/min via INTRAVENOUS

## 2023-09-25 MED ORDER — PROPOFOL 10 MG/ML IV BOLUS
INTRAVENOUS | Status: DC | PRN
  Administered 2023-09-25: 30 mg via INTRAVENOUS
  Administered 2023-09-25: 50 mg via INTRAVENOUS

## 2023-09-25 MED ORDER — DEXMEDETOMIDINE HCL IN NACL 80 MCG/20ML IV SOLN
INTRAVENOUS | Status: AC
  Filled 2023-09-25: qty 20

## 2023-09-25 NOTE — Op Note (Signed)
 [facility] Patient Name: Miranda Everett Procedure Date: 09/25/2023 9:02 AM MRN: [id number] Account #: [id number] Date of Birth: 1976-04-26 Admit Type: Outpatient Age: 48 Room: [facility] [address] ENDO ROOM 3 Gender: Female Note Status: Finalized Instrument Name: Prentice Docker [id number] Procedure:             Colonoscopy Indications:           Screening for colorectal malignant neoplasm Providers:             Wyline Mood MD, MD Referring MD:          Langley Gauss (Referring MD) Medicines:             Monitored Anesthesia Care Complications:         No immediate complications. Procedure:             Pre-Anesthesia Assessment:                        - Prior to the procedure, a History and Physical was                         performed, and patient medications, allergies and                         sensitivities were reviewed. The patient's tolerance                         of previous anesthesia was reviewed.                        - The risks and benefits of the procedure and the                         sedation options and risks were discussed with the                         patient. All questions were answered and informed                         consent was obtained.                        - ASA Grade Assessment: II - A patient with mild                         systemic disease.                        After obtaining informed consent, the colonoscope was                         passed under direct vision. Throughout the procedure,                         the patient's blood pressure, pulse, and oxygen                         saturations were monitored continuously. The                         Colonoscope was introduced through  the anus and                         advanced to the the cecum, identified by the                         appendiceal orifice. The colonoscopy was performed                         with ease. The patient tolerated the procedure well.                          The quality of the bowel preparation was good. The                         ileocecal valve, appendiceal orifice, and rectum were                         photographed. Findings:      The perianal and digital rectal examinations were normal.      The entire examined colon appeared normal on direct and retroflexion       views.      Multiple small-mouthed diverticula were found in the sigmoid colon. Impression:            - The entire examined colon is normal on direct and                         retroflexion views.                        - No specimens collected. Recommendation:        - Discharge patient to home (with escort).                        - Resume previous diet.                        - Continue present medications.                        - Repeat colonoscopy in 10 years for screening                         purposes. Procedure Code(s):     --- Professional ---                        [phone], Colonoscopy, flexible; diagnostic, including                         collection of specimen(s) by brushing or washing, when                         performed (separate procedure) Diagnosis Code(s):     --- Professional ---                        Z12.11, Encounter for screening for malignant neoplasm                         of colon CPT copyright  2022 American Medical Association. All rights reserved. The codes documented in this report are preliminary and upon coder review may  be revised to meet current compliance requirements. Wyline Mood, MD Wyline Mood MD, MD 09/25/2023 9:29:41 AM This report has been signed electronically. Number of Addenda: 0 Note Initiated On: 09/25/2023 9:02 AM Scope Withdrawal Time: 0 hours 9 minutes 53 seconds  Total Procedure Duration: 0 hours 13 minutes 17 seconds  Estimated Blood Loss:  Estimated blood loss: none.      [facility]

## 2023-09-25 NOTE — H&P (Signed)
 Wyline Mood, MD [address], [address], [place], [place], [address] [address], [place], [place], [address] Phone: [phone]  Fax: [phone]  Primary Care Physician:  Langley Gauss, [place]   Pre-Procedure History & Physical: HPI:  Miranda Everett is a 48 y.o. female is here for an colonoscopy.   History reviewed. No pertinent past medical history.  Past Surgical History:  Procedure Laterality Date   CESAREAN SECTION     REDUCTION MAMMAPLASTY Bilateral 2008    Prior to Admission medications   Medication Sig Start Date End Date Taking? Authorizing Provider  EPINEPHrine (EPIPEN 2-PAK) 0.3 mg/0.3 mL IJ SOAJ injection Inject 0.3 mg into the muscle as needed for anaphylaxis. 08/22/23   Langley Gauss, PA    Allergies as of 08/27/2023 - Review Complete 08/27/2023  Allergen Reaction Noted   Fire ant Anaphylaxis 08/22/2023    Family History  Problem Relation Age of Onset   Cancer Mother    Alcohol abuse Father     Social History   Socioeconomic History   Marital status: Married    Spouse name: Not on file   Number of children: Not on file   Years of education: Not on file   Highest education level: Bachelor's degree (e.g., BA, AB, BS)  Occupational History   Not on file  Tobacco Use   Smoking status: Never   Smokeless tobacco: Never  Vaping Use   Vaping status: Never Used  Substance and Sexual Activity   Alcohol use: Never   Drug use: Never   Sexual activity: Yes    Birth control/protection: Other-see comments    Comment: Husband fixed  Other Topics Concern   Not on file  Social History Narrative   Not on file   Social Drivers of Health   Financial Resource Strain: Low Risk  (08/15/2023)   Overall Financial Resource Strain (CARDIA)    Difficulty of Paying Living Expenses: Not hard at all  Food Insecurity: No Food Insecurity (08/22/2023)   Hunger Vital Sign    Worried About Running Out of Food in the Last Year: Never true    Ran Out of Food  in the Last Year: Never true  Transportation Needs: No Transportation Needs (08/15/2023)   PRAPARE - Administrator, Civil Service (Medical): No    Lack of Transportation (Non-Medical): No  Physical Activity: Insufficiently Active (08/15/2023)   Exercise Vital Sign    Days of Exercise per Week: 2 days    Minutes of Exercise per Session: 20 min  Stress: No Stress Concern Present (08/15/2023)   [employer] of Occupational Health - Occupational Stress Questionnaire    Feeling of Stress : Only a little  Social Connections: Socially Integrated (08/15/2023)   Social Connection and Isolation Panel [NHANES]    Frequency of Communication with Friends and Family: More than three times a week    Frequency of Social Gatherings with Friends and Family: Once a week    Attends Religious Services: More than 4 times per year    Active Member of [employer] or Organizations: Yes    Attends Engineer, structural: More than 4 times per year    Marital Status: Married  Catering manager Violence: Not on file    Review of Systems: See HPI, otherwise negative ROS  Physical Exam: BP (!) 135/90   Pulse 76   Temp (!) 97 F (36.1 C) (Temporal)   Ht 5\' 5"  (1.651 m)   Wt 61.7 kg  SpO2 100%   BMI 22.63 kg/m  General:   Alert,  pleasant and cooperative in NAD Head:  Normocephalic and atraumatic. Neck:  Supple; no masses or thyromegaly. Lungs:  Clear throughout to auscultation, normal respiratory effort.    Heart:  +S1, +S2, Regular rate and rhythm, No edema. Abdomen:  Soft, nontender and nondistended. Normal bowel sounds, without guarding, and without rebound.   Neurologic:  Alert and  oriented x4;  grossly normal neurologically.  Impression/Plan: Miranda Everett is here for an colonoscopy to be performed for Screening colonoscopy average risk   Risks, benefits, limitations, and alternatives regarding  colonoscopy have been reviewed with the patient.  Questions have been answered.   All parties agreeable.   Wyline Mood, MD  09/25/2023, 8:32 AM

## 2023-09-25 NOTE — Anesthesia Preprocedure Evaluation (Signed)
 Anesthesia Evaluation  Patient identified by MRN, date of birth, ID band Patient awake    Reviewed: Allergy & Precautions, H&P , NPO status , Patient's Chart, lab work & pertinent test results, reviewed documented beta blocker date and time   History of Anesthesia Complications Negative for: history of anesthetic complications  Airway Mallampati: II  TM Distance: >3 FB Neck ROM: full    Dental  (+) Dental Advidsory Given, Implants, Teeth Intact   Pulmonary neg pulmonary ROS   Pulmonary exam normal breath sounds clear to auscultation       Cardiovascular Exercise Tolerance: Good negative cardio ROS Normal cardiovascular exam Rhythm:regular Rate:Normal     Neuro/Psych negative neurological ROS  negative psych ROS   GI/Hepatic negative GI ROS, Neg liver ROS,,,  Endo/Other  negative endocrine ROS    Renal/GU negative Renal ROS  negative genitourinary   Musculoskeletal   Abdominal   Peds  Hematology negative hematology ROS (+)   Anesthesia Other Findings History reviewed. No pertinent past medical history.   Reproductive/Obstetrics negative OB ROS                             Anesthesia Physical Anesthesia Plan  ASA: 1  Anesthesia Plan: General   Post-op Pain Management:    Induction: Intravenous  PONV Risk Score and Plan: 3 and Propofol infusion and TIVA  Airway Management Planned: Natural Airway and Nasal Cannula  Additional Equipment:   Intra-op Plan:   Post-operative Plan:   Informed Consent: I have reviewed the patients History and Physical, chart, labs and discussed the procedure including the risks, benefits and alternatives for the proposed anesthesia with the patient or authorized representative who has indicated his/her understanding and acceptance.     Dental Advisory Given  Plan Discussed with: Anesthesiologist, CRNA and Surgeon  Anesthesia Plan Comments:          Anesthesia Quick Evaluation

## 2023-09-25 NOTE — Transfer of Care (Signed)
 Immediate Anesthesia Transfer of Care Note  Patient: Miranda Everett  Procedure(s) Performed: COLONOSCOPY  Patient Location: PACU  Anesthesia Type:General  Level of Consciousness: sedated  Airway & Oxygen Therapy: Patient Spontanous Breathing  Post-op Assessment: Report given to RN and Post -op Vital signs reviewed and stable  Post vital signs: Reviewed and stable  Last Vitals:  Vitals Value Taken Time  BP 112/70 09/25/23 0932  Temp 36.4 C 09/25/23 0930  Pulse 71 09/25/23 0932  Resp 20 09/25/23 0932  SpO2 98 % 09/25/23 0932  Vitals shown include unfiled device data.  Last Pain:  Vitals:   09/25/23 0930  TempSrc: Temporal  PainSc: 0-No pain         Complications: No notable events documented.

## 2023-09-26 ENCOUNTER — Encounter: Payer: Self-pay | Admitting: Gastroenterology

## 2023-09-27 NOTE — Anesthesia Postprocedure Evaluation (Signed)
 Anesthesia Post Note  Patient: Miranda Everett  Procedure(s) Performed: COLONOSCOPY  Patient location during evaluation: Endoscopy Anesthesia Type: General Level of consciousness: awake and alert Pain management: pain level controlled Vital Signs Assessment: post-procedure vital signs reviewed and stable Respiratory status: spontaneous breathing, nonlabored ventilation, respiratory function stable and patient connected to nasal cannula oxygen Cardiovascular status: blood pressure returned to baseline and stable Postop Assessment: no apparent nausea or vomiting Anesthetic complications: no   No notable events documented.   Last Vitals:  Vitals:   09/25/23 0930 09/25/23 0940  BP: 112/70 106/69  Pulse: 67   Resp: 16   Temp: 36.4 C   SpO2: 100%     Last Pain:  Vitals:   09/25/23 0950  TempSrc:   PainSc: 0-No pain                 Lenard Simmer

## 2023-11-25 ENCOUNTER — Ambulatory Visit: Admitting: Physician Assistant

## 2023-12-13 ENCOUNTER — Ambulatory Visit: Admitting: Physician Assistant

## 2023-12-25 ENCOUNTER — Other Ambulatory Visit (HOSPITAL_BASED_OUTPATIENT_CLINIC_OR_DEPARTMENT_OTHER): Admitting: Radiology

## 2023-12-25 ENCOUNTER — Ambulatory Visit (INDEPENDENT_AMBULATORY_CARE_PROVIDER_SITE_OTHER): Admitting: Physician Assistant

## 2023-12-25 ENCOUNTER — Encounter: Payer: Self-pay | Admitting: Physician Assistant

## 2023-12-25 VITALS — BP 130/78 | HR 88 | Temp 97.9°F | Resp 16 | Ht 65.0 in | Wt 136.0 lb

## 2023-12-25 DIAGNOSIS — R1011 Right upper quadrant pain: Secondary | ICD-10-CM | POA: Insufficient documentation

## 2023-12-25 DIAGNOSIS — R454 Irritability and anger: Secondary | ICD-10-CM | POA: Insufficient documentation

## 2023-12-25 DIAGNOSIS — R7303 Prediabetes: Secondary | ICD-10-CM | POA: Diagnosis not present

## 2023-12-25 DIAGNOSIS — K5790 Diverticulosis of intestine, part unspecified, without perforation or abscess without bleeding: Secondary | ICD-10-CM | POA: Diagnosis not present

## 2023-12-25 MED ORDER — ESTRADIOL 0.0375 MG/24HR TD PTWK: 0.0375 mg | MEDICATED_PATCH | TRANSDERMAL | 12 refills | Status: AC

## 2023-12-25 NOTE — Assessment & Plan Note (Signed)
 Intermittent shooting pain in the right upper quadrant. Differential diagnosis includes gallbladder issues. Liver function normal, recent colonoscopy unremarkable except for minimal diverticulosis. - Order ultrasound of the gallbladder to assess for stones or sludge.

## 2023-12-25 NOTE — Patient Instructions (Signed)
 VISIT SUMMARY:  You came in today because you have been experiencing irritability and mood swings, as well as a recent onset of intermittent right upper quadrant pain. We also discussed your prediabetes management and your recent colonoscopy results which showed minimal diverticulosis.  YOUR PLAN:  -IRRITABILITY AND MOOD SWINGS: You have been experiencing irritability and mood swings, which may be related to stress or hormonal changes. We discussed the possibility of early menopause and the roles of cortisol and thyroid levels in mood changes. We will check your hormone and cortisol levels and start you on an estradiol  patch to help with your symptoms. Please be aware of potential side effects like abnormal periods, cramping, and breast tenderness.  -RIGHT UPPER QUADRANT PAIN: You have been having intermittent pain in your right upper abdomen. We need to check your gallbladder for any issues, so we will order an ultrasound to look for stones or sludge.  -PREDIABETES: Your blood sugar levels indicate prediabetes, which means your blood sugar is higher than normal but not high enough to be diabetes. You are doing a great job managing your diet and exercise. Keep monitoring your blood sugar levels, and we will re-evaluate your A1c and blood sugar levels in 3 months. We may also consider an advanced cholesterol panel at your next follow-up.  -DIVERTICULOSIS: You have minimal diverticulosis, which means there are small pouches in your colon. It is currently asymptomatic. To prevent it from getting worse, we recommend a high-fiber diet and possibly using Benefiber or Metamucil every other day. Continue eating yogurt for probiotics.  -GENERAL HEALTH MAINTENANCE: You are making positive lifestyle changes to improve your overall health. Keep up with weight resistance training to prevent osteoporosis and stay physically active to reduce the risk of Alzheimer's and dementia.  INSTRUCTIONS:  Please schedule a  follow-up appointment in 3 months to reassess your conditions and the effectiveness of the management plan. Monitor for any side effects from the estradiol  patch and report if your irritability worsens or does not change.
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
## 2023-12-25 NOTE — Assessment & Plan Note (Signed)
 Minimal diverticulosis in the sigmoid colon, asymptomatic. Advised on dietary modifications to prevent progression to diverticulitis. - Recommend a high-fiber diet. - Consider using Benefiber or Metamucil every other day. - Encourage continued consumption of yogurt for probiotics.

## 2023-12-25 NOTE — Assessment & Plan Note (Signed)
 Experiencing irritability and mood swings possibly related to stress or hormonal changes. Discussed early menopause, cortisol, and thyroid levels' role in mood changes. Considering estradiol  patch for symptom relief. Potential side effects include abnormal periods, cramping, and breast tenderness. - Order hormone panel to assess for early menopause. - Order cortisol test to evaluate stress levels. - Prescribe estradiol  patch at 0.0375 mg/24 hours. - Educate on potential side effects of estradiol  patch, including abnormal periods, cramping, and breast tenderness.

## 2023-12-25 NOTE — Assessment & Plan Note (Signed)
 Prediabetes with A1c of 5.7%. Fasting blood sugar levels between 98-107 mg/dL, postprandial levels in the 80s. Discussed dawn phenomenon and late-night exercise impact on blood sugar. Actively managing diet and exercise. - Continue monitoring blood sugar levels. - Encourage continued diet and exercise modifications. - Re-evaluate A1c and blood sugar levels in 3 months. - Consider advanced cholesterol panel at next follow-up.

## 2023-12-26 ENCOUNTER — Ambulatory Visit (HOSPITAL_BASED_OUTPATIENT_CLINIC_OR_DEPARTMENT_OTHER)
Admission: RE | Admit: 2023-12-26 | Discharge: 2023-12-26 | Disposition: A | Discharge status: Home / Self Care | Source: Ambulatory Visit | Attending: Physician Assistant | Admitting: Physician Assistant

## 2023-12-26 DIAGNOSIS — R1011 Right upper quadrant pain: Secondary | ICD-10-CM | POA: Diagnosis not present

## 2023-12-26 LAB — CBC WITH DIFFERENTIAL/PLATELET
Basophils Absolute: 0 x10E3/uL (ref 0.0–0.2)
Basos: 1 %
EOS (ABSOLUTE): 0.1 x10E3/uL (ref 0.0–0.4)
Eos: 2 %
Hematocrit: 45.8 % (ref 34.0–46.6)
Hemoglobin: 15 g/dL (ref 11.1–15.9)
Immature Grans (Abs): 0 x10E3/uL (ref 0.0–0.1)
Immature Granulocytes: 0 %
Lymphocytes Absolute: 1.5 x10E3/uL (ref 0.7–3.1)
Lymphs: 26 %
MCH: 30.7 pg (ref 26.6–33.0)
MCHC: 32.8 g/dL (ref 31.5–35.7)
MCV: 94 fL (ref 79–97)
Monocytes Absolute: 0.4 x10E3/uL (ref 0.1–0.9)
Monocytes: 7 %
Neutrophils Absolute: 3.6 x10E3/uL (ref 1.4–7.0)
Neutrophils: 64 %
Platelets: 305 x10E3/uL (ref 150–450)
RBC: 4.89 x10E6/uL (ref 3.77–5.28)
RDW: 12.5 % (ref 11.7–15.4)
WBC: 5.6 x10E3/uL (ref 3.4–10.8)

## 2023-12-26 LAB — LIPID PANEL
Chol/HDL Ratio: 3.8 ratio (ref 0.0–4.4)
Cholesterol, Total: 168 mg/dL (ref 100–199)
HDL: 44 mg/dL (ref >39)
LDL Chol Calc (NIH): 111 mg/dL — ABNORMAL HIGH (ref 0–99)
Triglycerides: 70 mg/dL (ref 0–149)
VLDL Cholesterol Cal: 13 mg/dL (ref 5–40)

## 2023-12-26 LAB — CMP14+EGFR
ALT: 14 IU/L (ref 0–32)
AST: 16 IU/L (ref 0–40)
Albumin: 4.7 g/dL (ref 3.9–4.9)
Alkaline Phosphatase: 99 IU/L (ref 44–121)
BUN/Creatinine Ratio: 16 (ref 9–23)
BUN: 14 mg/dL (ref 6–24)
Bilirubin Total: 0.7 mg/dL (ref 0.0–1.2)
CO2: 21 mmol/L (ref 20–29)
Calcium: 9.7 mg/dL (ref 8.7–10.2)
Chloride: 101 mmol/L (ref 96–106)
Creatinine, Ser: 0.89 mg/dL (ref 0.57–1.00)
Globulin, Total: 2.8 g/dL (ref 1.5–4.5)
Glucose: 95 mg/dL (ref 70–99)
Potassium: 4.6 mmol/L (ref 3.5–5.2)
Sodium: 139 mmol/L (ref 134–144)
Total Protein: 7.5 g/dL (ref 6.0–8.5)
eGFR: 80 mL/min/1.73 (ref >59)

## 2023-12-26 LAB — HEMOGLOBIN A1C
Est. average glucose Bld gHb Est-mCnc: 114 mg/dL
Hgb A1c MFr Bld: 5.6 % (ref 4.8–5.6)

## 2023-12-26 LAB — TESTOSTERONE,FREE AND TOTAL
Testosterone, Free: 5 pg/mL — ABNORMAL HIGH (ref 0.0–4.2)
Testosterone: 24 ng/dL (ref 4–50)

## 2023-12-26 LAB — ESTRADIOL: Estradiol: 141 pg/mL

## 2023-12-26 LAB — FSH/LH
FSH: 15.3 m[IU]/mL
LH: 15.1 m[IU]/mL

## 2023-12-26 LAB — PROGESTERONE: Progesterone: 0.4 ng/mL

## 2023-12-26 LAB — CORTISOL: Cortisol: 10.6 ug/dL (ref 6.2–19.4)

## 2023-12-26 LAB — TSH: TSH: 1.01 u[IU]/mL (ref 0.450–4.500)

## 2023-12-26 LAB — DHEA-SULFATE: DHEA-SO4: 119 ug/dL (ref 41.2–243.7)

## 2023-12-29 ENCOUNTER — Ambulatory Visit: Payer: Self-pay | Admitting: Physician Assistant

## 2024-02-13 DIAGNOSIS — L820 Inflamed seborrheic keratosis: Secondary | ICD-10-CM | POA: Diagnosis not present

## 2024-03-30 ENCOUNTER — Ambulatory Visit: Admitting: Physician Assistant
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
## 2024-04-22 ENCOUNTER — Encounter: Payer: Self-pay | Admitting: Physician Assistant

## 2024-04-22 ENCOUNTER — Ambulatory Visit: Admitting: Physician Assistant

## 2024-04-22 ENCOUNTER — Ambulatory Visit: Payer: Self-pay | Admitting: Physician Assistant

## 2024-04-22 VITALS — BP 122/94

## 2024-04-22 DIAGNOSIS — R454 Irritability and anger: Secondary | ICD-10-CM | POA: Diagnosis not present

## 2024-04-22 DIAGNOSIS — R1011 Right upper quadrant pain: Secondary | ICD-10-CM | POA: Diagnosis not present

## 2024-04-22 DIAGNOSIS — E782 Mixed hyperlipidemia: Secondary | ICD-10-CM

## 2024-04-22 DIAGNOSIS — R7303 Prediabetes: Secondary | ICD-10-CM | POA: Diagnosis not present

## 2024-04-22 LAB — POCT LIPID PANEL
HDL: 48 — NL
LDL: 110 — AB
Non-HDL: 144 — NL
TC/HDL: 2.3 — NL
TC: 192 — NL
TRG: 172 — AB

## 2024-04-22 NOTE — Assessment & Plan Note (Signed)
 Controlled Continue to monitor diet and exercise Will draw labs again next year Lab Results  Component Value Date   HGBA1C 5.6 12/25/2023   HGBA1C 5.7 (H) 08/22/2023

## 2024-04-22 NOTE — Assessment & Plan Note (Signed)
 Improved Denies any new or changing symptoms Continue to monitor Will adjust treatment based on symptoms

## 2024-04-22 NOTE — Assessment & Plan Note (Addendum)
 Improved Continue to monitor symptoms Continue using Estradiol  patch Will adjust treatment based on symptoms

## 2024-04-29 ENCOUNTER — Encounter: Payer: Self-pay | Admitting: Physician Assistant

## 2024-09-22 ENCOUNTER — Ambulatory Visit: Admitting: Physician Assistant
